# Patient Record
Sex: Male | Born: 1990 | Race: Black or African American | Hispanic: No | Marital: Single | Smoking: Current every day smoker
Health system: Southern US, Community
[De-identification: ages and names within clinical notes are randomized; demographics above are authoritative.]

---
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
## 2010-11-16 IMAGING — CR RIGHT HAND - COMPLETE 3+ VIEW
1 series · 3 of 3 positions shown · non-contrast
Comparison: none

REASON FOR EXAM: pain
COMMENTS:

PROCEDURE:     DXR - DXR HAND RT COMPLETE W/OBLIQUES  - September 22, 2009  [DATE]
RESULT:     Images of the right hand demonstrate no fracture, dislocation or
radiopaque foreign body.

[Series 1: view not recorded · 0.17mm/px · 3 of 3 slices shown]
[im 1/3]
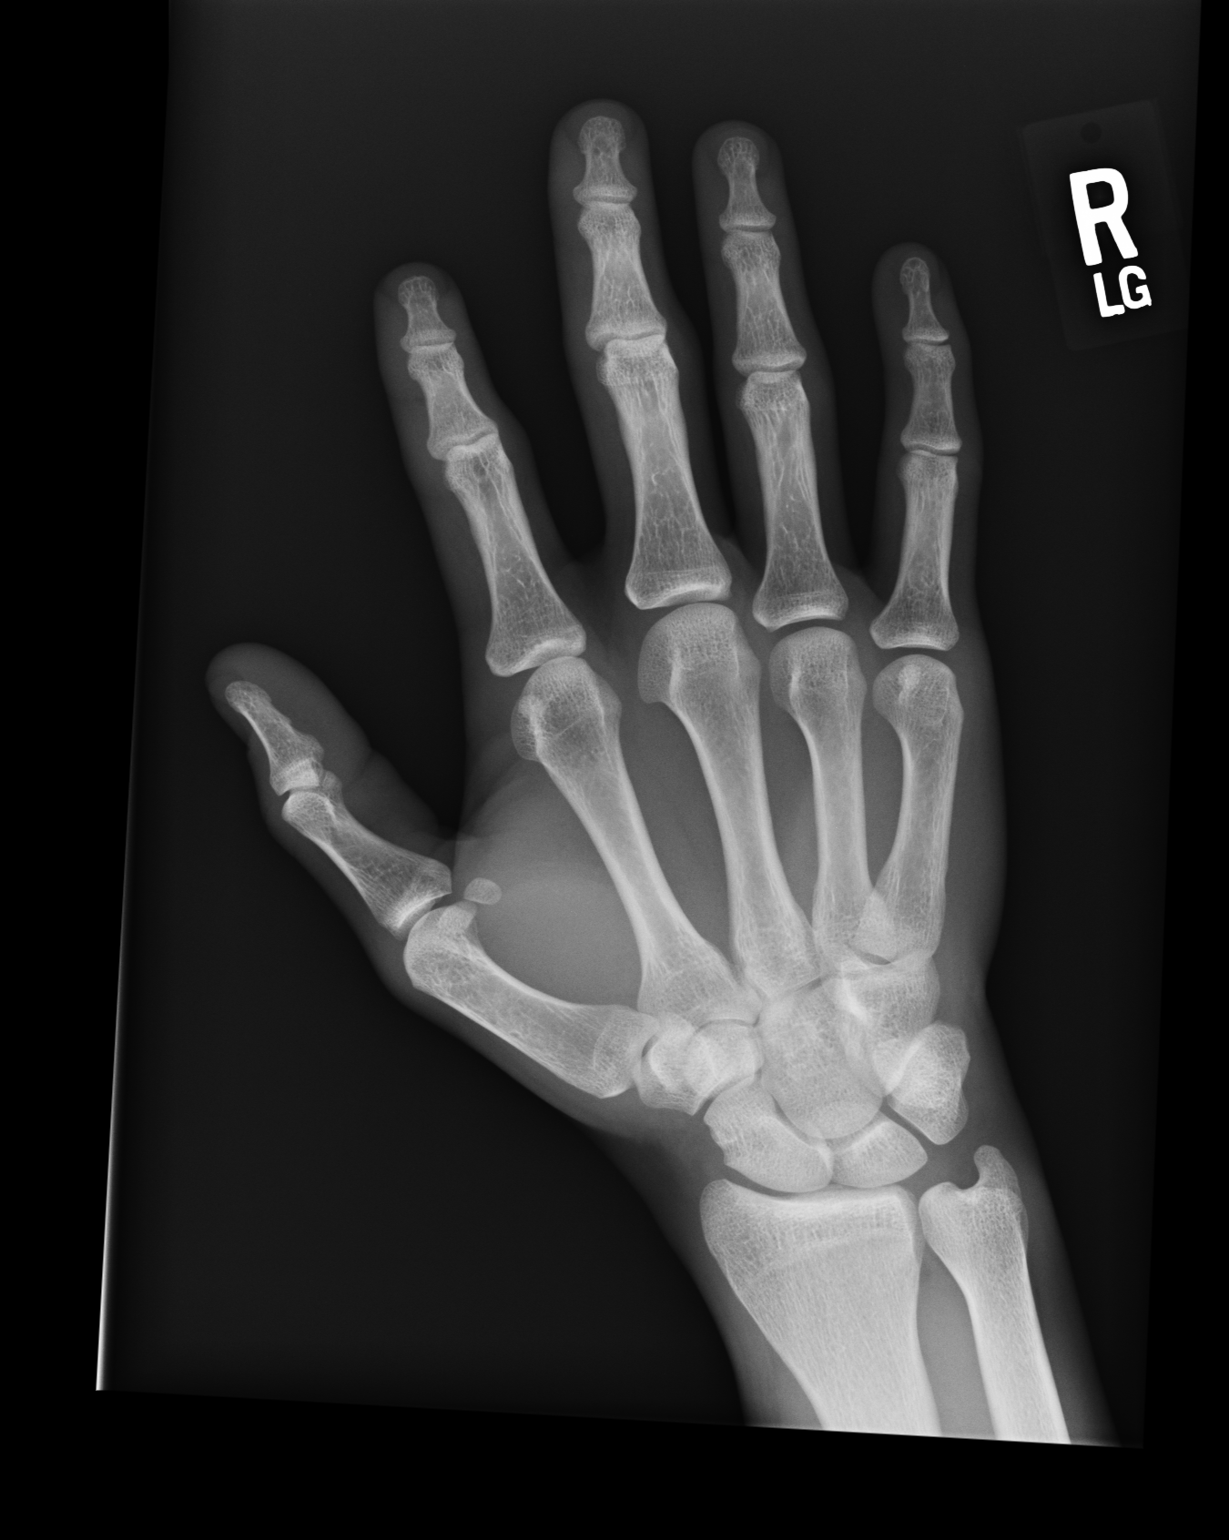
[im 2/3]
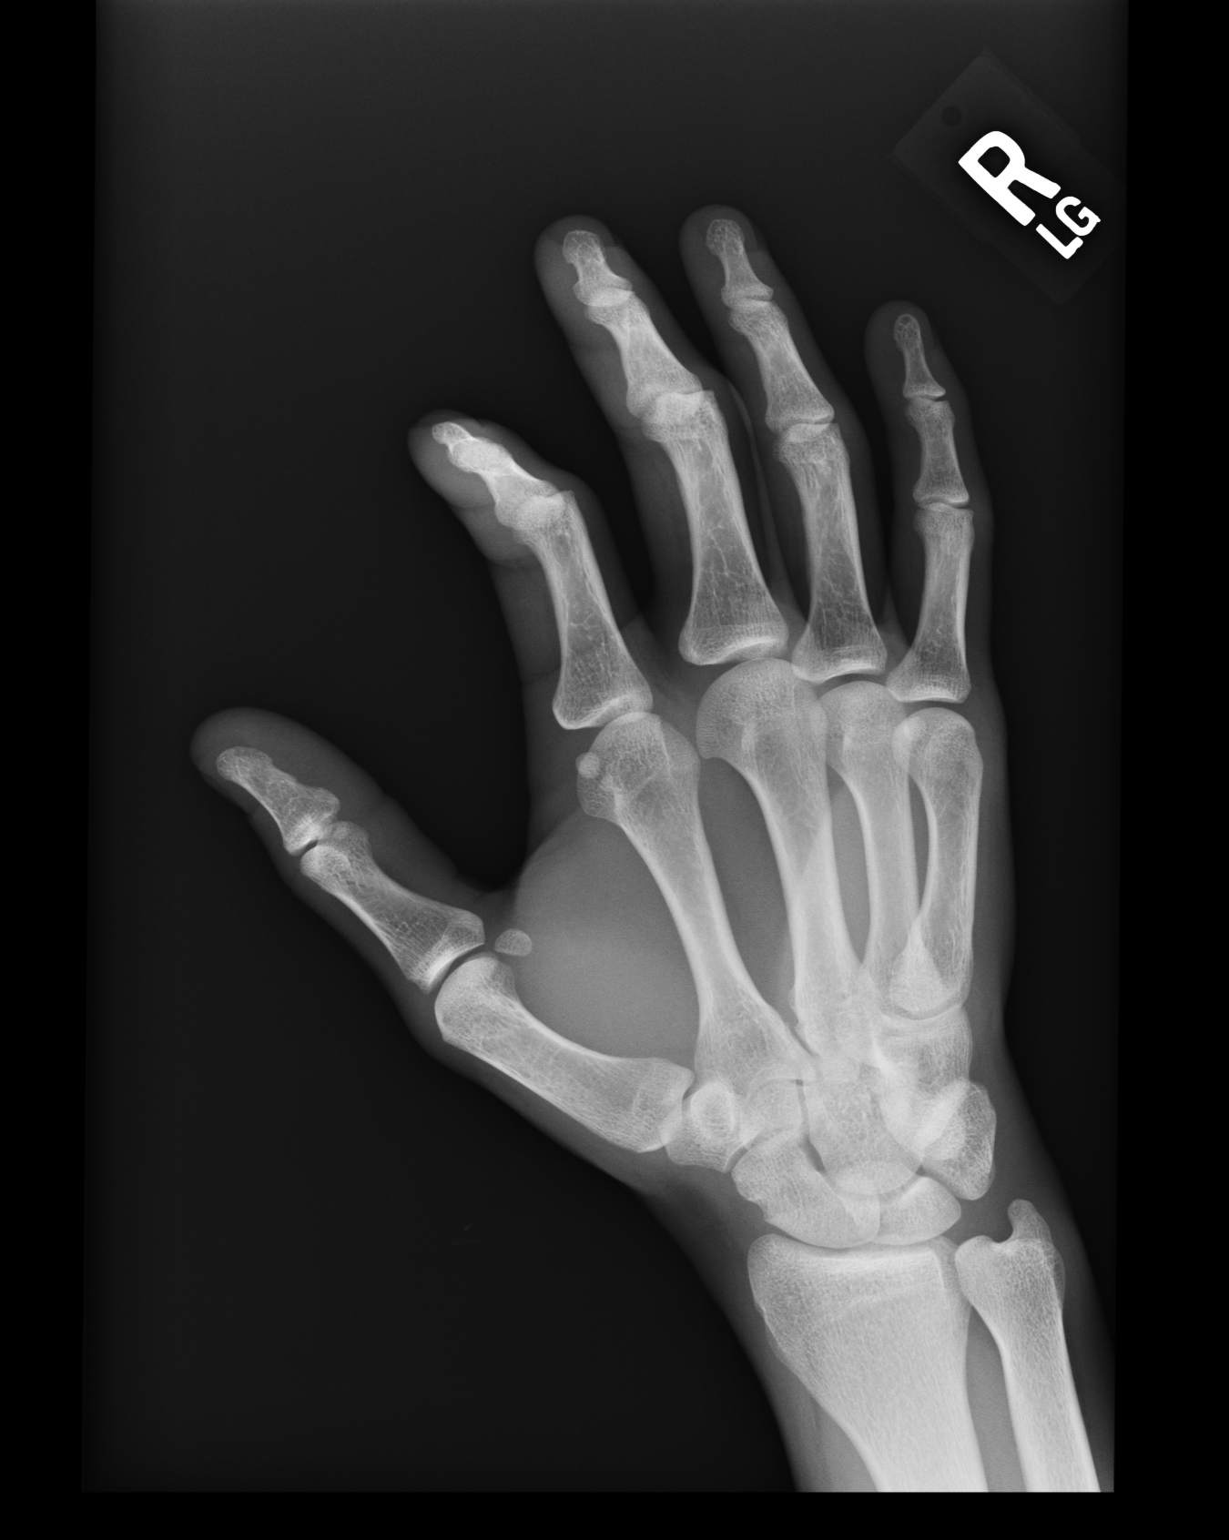
[im 3/3]
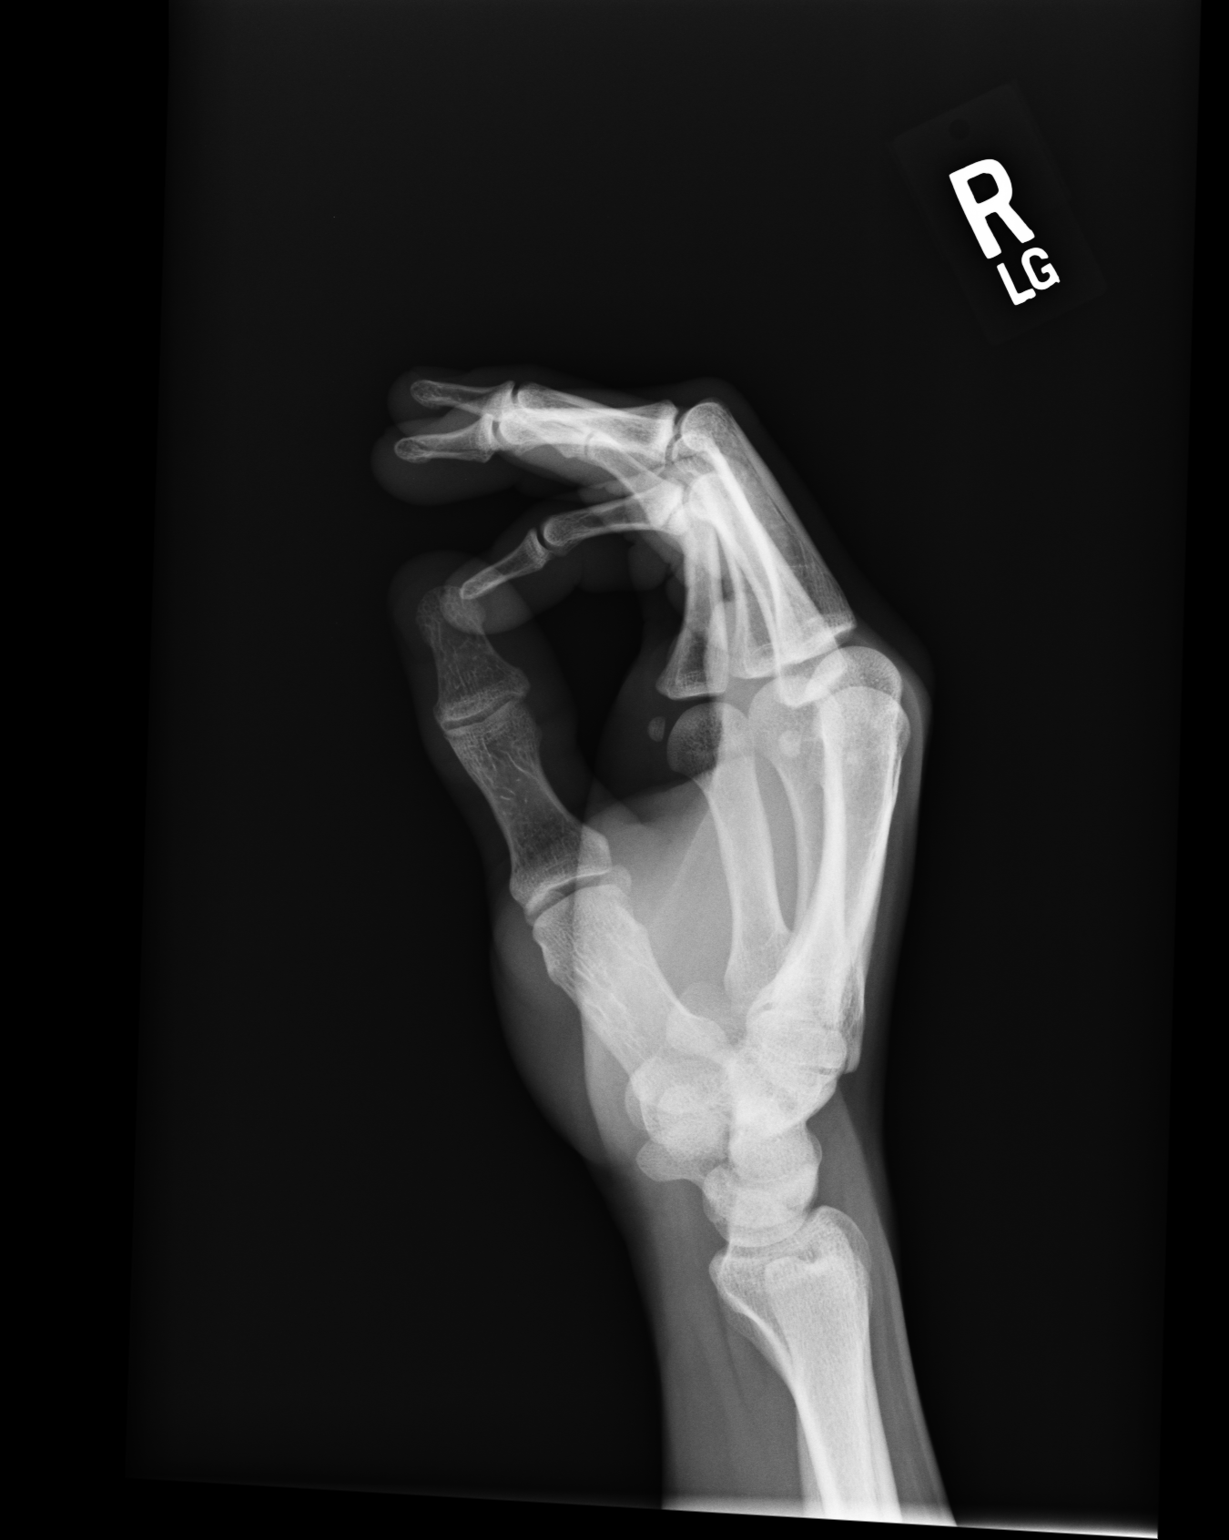

[3 of 3 positions shown; findings below may reference images not displayed]

IMPRESSION: Please see above.

## 2018-02-16 ENCOUNTER — Other Ambulatory Visit: Payer: Self-pay | Admitting: Counselor

## 2018-02-16 DIAGNOSIS — R1032 Left lower quadrant pain: Secondary | ICD-10-CM

## 2018-02-21 ENCOUNTER — Ambulatory Visit
Admission: RE | Admit: 2018-02-21 | Discharge: 2018-02-21 | Disposition: A | Discharge status: Home / Self Care | Payer: 59 | Source: Ambulatory Visit | Attending: Counselor | Admitting: Counselor

## 2018-03-09 ENCOUNTER — Ambulatory Visit: Payer: Self-pay | Admitting: Family Medicine

## 2019-03-14 ENCOUNTER — Telehealth: Payer: 59 | Admitting: Family

## 2019-03-14 DIAGNOSIS — F17200 Nicotine dependence, unspecified, uncomplicated: Secondary | ICD-10-CM

## 2019-03-14 DIAGNOSIS — J069 Acute upper respiratory infection, unspecified: Secondary | ICD-10-CM

## 2019-03-14 MED ORDER — BENZONATATE 100 MG PO CAPS: 100.0000 mg | ORAL_CAPSULE | Freq: Three times a day (TID) | ORAL | 0 refills | Status: DC | PRN

## 2019-03-14 MED ORDER — FLUTICASONE PROPIONATE 50 MCG/ACT NA SUSP: 2.0000 | Freq: Every day | NASAL | 6 refills | Status: DC

## 2019-03-14 NOTE — Progress Notes (Signed)
We are sorry you are not feeling well.  Here is how we plan to help!  Based on what you have shared with me, it looks like you may have a viral upper respiratory infection.  Upper respiratory infections are caused by a large number of viruses; however, rhinovirus is the most common cause.   As discussed on the phone, since you do have a fever I do recommend you avoid all contact with anyone 65 or older and self isolate yourself over the next 2 weeks. I hope you feel better soon!  I called patient to investigate his SOB. States he is a smoker and when he gets any cold or infection he get slightly SOB with exercise. No SOB or distress noted while talking to him on the phone. States this is no different from any other URI he has in the past.   Approximately 5 minutes was spent documenting and reviewing patient's chart.    Symptoms vary from person to person, with common symptoms including sore throat, cough, and fatigue or lack of energy.  A low-grade fever of up to 100.4 may present, but is often uncommon.  Symptoms vary however, and are closely related to a person's age or underlying illnesses.  The most common symptoms associated with an upper respiratory infection are nasal discharge or congestion, cough, sneezing, headache and pressure in the ears and face.  These symptoms usually persist for about 3 to 10 days, but can last up to 2 weeks.  It is important to know that upper respiratory infections do not cause serious illness or complications in most cases.    Upper respiratory infections can be transmitted from person to person, with the most common method of transmission being a person's hands.  The virus is able to live on the skin and can infect other persons for up to 2 hours after direct contact.  Also, these can be transmitted when someone coughs or sneezes; thus, it is important to cover the mouth to reduce this risk.  To keep the spread of the illness at bay, good hand hygiene is very  important.  This is an infection that is most likely caused by a virus. There are no specific treatments other than to help you with the symptoms until the infection runs its course.  We are sorry you are not feeling well.  Here is how we plan to help!   For nasal congestion, you may use an oral decongestants such as Mucinex D or if you have glaucoma or high blood pressure use plain Mucinex.  Saline nasal spray or nasal drops can help and can safely be used as often as needed for congestion.  For your congestion, I have prescribed Fluticasone nasal spray one spray in each nostril twice a day  If you do not have a history of heart disease, hypertension, diabetes or thyroid disease, prostate/bladder issues or glaucoma, you may also use Sudafed to treat nasal congestion.  It is highly recommended that you consult with a pharmacist or your primary care physician to ensure this medication is safe for you to take.     If you have a cough, you may use cough suppressants such as Delsym and Robitussin.  If you have glaucoma or high blood pressure, you can also use Coricidin HBP.   For cough I have prescribed for you A prescription cough medication called Tessalon Perles 100 mg. You may take 1-2 capsules every 8 hours as needed for cough  If you have a  sore or scratchy throat, use a saltwater gargle-  to  teaspoon of salt dissolved in a 4-ounce to 8-ounce glass of warm water.  Gargle the solution for approximately 15-30 seconds and then spit.  It is important not to swallow the solution.  You can also use throat lozenges/cough drops and Chloraseptic spray to help with throat pain or discomfort.  Warm or cold liquids can also be helpful in relieving throat pain.  For headache, pain or general discomfort, you can use Ibuprofen or Tylenol as directed.   Some authorities believe that zinc sprays or the use of Echinacea may shorten the course of your symptoms.   HOME CARE . Only take medications as instructed  by your medical team. . Be sure to drink plenty of fluids. Water is fine as well as fruit juices, sodas and electrolyte beverages. You may want to stay away from caffeine or alcohol. If you are nauseated, try taking small sips of liquids. How do you know if you are getting enough fluid? Your urine should be a pale yellow or almost colorless. . Get rest. . Taking a steamy shower or using a humidifier may help nasal congestion and ease sore throat pain. You can place a towel over your head and breathe in the steam from hot water coming from a faucet. . Using a saline nasal spray works much the same way. . Cough drops, hard candies and sore throat lozenges may ease your cough. . Avoid close contacts especially the very young and the elderly . Cover your mouth if you cough or sneeze . Always remember to wash your hands.   GET HELP RIGHT AWAY IF: . You develop worsening fever. . If your symptoms do not improve within 10 days . You develop yellow or green discharge from your nose over 3 days. . You have coughing fits . You develop a severe head ache or visual changes. . You develop shortness of breath, difficulty breathing or start having chest pain . Your symptoms persist after you have completed your treatment plan  MAKE SURE YOU   Understand these instructions.  Will watch your condition.  Will get help right away if you are not doing well or get worse.  Your e-visit answers were reviewed by a board certified advanced clinical practitioner to complete your personal care plan. Depending upon the condition, your plan could have included both over the counter or prescription medications. Please review your pharmacy choice. If there is a problem, you may call our nursing hot line at and have the prescription routed to another pharmacy. Your safety is important to [place]. If you have drug allergies check your prescription carefully.   You can use MyChart to ask questions about today's visit,  request a non-urgent call back, or ask for a work or school excuse for 24 hours related to this e-Visit. If it has been greater than 24 hours you will need to follow up with your provider, or enter a new e-Visit to address those concerns. You will get an e-mail in the next two days asking about your experience.  I hope that your e-visit has been valuable and will speed your recovery. Thank you for using e-visits.

## 2019-03-19 ENCOUNTER — Other Ambulatory Visit: Payer: Self-pay

## 2019-03-19 ENCOUNTER — Ambulatory Visit
Admission: EM | Admit: 2019-03-19 | Discharge: 2019-03-19 | Disposition: A | Discharge status: Home / Self Care | Payer: Self-pay | Attending: Family Medicine | Admitting: Family Medicine

## 2019-03-19 DIAGNOSIS — F17210 Nicotine dependence, cigarettes, uncomplicated: Secondary | ICD-10-CM

## 2019-03-19 DIAGNOSIS — J302 Other seasonal allergic rhinitis: Secondary | ICD-10-CM

## 2019-03-19 NOTE — ED Provider Notes (Signed)
[facility]    CSN: [id number] Arrival date & time: 03/19/19  1017     History   Chief Complaint Chief Complaint  Patient presents with  . Sinus Problem    HPI Stanley Brown is a 28 y.o. male.   28 year old male presents with history of seasonal allergies. He started to sneeze last week at work and he had to be evaluated for possible illness. He had an e-visit on 03/14/19 in which he had some nasal congestion, sore throat and cough. He denies any fever. He does smoke so he occasionally has shortness of breath with flare up of URI or allergy symptoms but denies any SOB today. He took Flonase with some relief. He has tried multiple antihistamines and Flonase in the past with minimal success in controlling allergy symptoms but his symptoms are usually short-term. Otherwise no chronic health issues. Needs a note to return to work today.   The history is provided by the patient.    History reviewed. No pertinent past medical history.  There are no active problems to display for this patient.   Past Surgical History:  Procedure Laterality Date  . EYE SURGERY Left 1998       Home Medications    Prior to Admission medications   Not on File    Family History History reviewed. No pertinent family history.  Social History Social History   Tobacco Use  . Smoking status: Current Every Day Smoker    Packs/day: 0.50    Types: Cigarettes  . Smokeless tobacco: Never Used  Substance Use Topics  . Alcohol use: Not Currently  . Drug use: Never     Allergies   Patient has no known allergies.   Review of Systems Review of Systems  Constitutional: Negative for activity change, appetite change, chills, fatigue and fever.  HENT: Positive for congestion (occasional nasal ), postnasal drip, sneezing and sore throat (irritated throat last week). Negative for ear discharge, ear pain, facial swelling, mouth sores, nosebleeds, rhinorrhea, sinus pressure, sinus pain and  trouble swallowing.   Eyes: Negative for pain, discharge, redness and itching.  Respiratory: Positive for cough (last week but not now). Negative for chest tightness, shortness of breath and wheezing.   Gastrointestinal: Negative for abdominal pain, diarrhea, nausea and vomiting.  Musculoskeletal: Negative for arthralgias, myalgias, neck pain and neck stiffness.  Skin: Negative for color change, rash and wound.  Allergic/Immunologic: Positive for environmental allergies. Negative for immunocompromised state.  Neurological: Negative for dizziness, tremors, seizures, syncope, weakness, light-headedness, numbness and headaches.  Hematological: Negative for adenopathy. Does not bruise/bleed easily.  Psychiatric/Behavioral: Negative.      Physical Exam Triage Vital Signs ED Triage Vitals  Enc Vitals Group     BP 03/19/19 1012 130/84     Pulse Rate 03/19/19 1012 74     Resp 03/19/19 1012 16     Temp 03/19/19 1012 98.8 F (37.1 C)     Temp Source 03/19/19 1012 Oral     SpO2 03/19/19 1012 100 %     Weight 03/19/19 1010 244 lb (110.7 kg)     Height 03/19/19 1010 6\' 4"  (1.93 m)     Head Circumference --      Peak Flow --      Pain Score 03/19/19 1010 0     Pain Loc --      Pain Edu? --      Excl. in GC? --    No data found.  Updated  Vital Signs BP 130/84 (BP Location: Right Arm)   Pulse 74   Temp 98.8 F (37.1 C) (Oral)   Resp 16   Ht  (1.93 m)   Wt 244 lb (110.7 kg)   SpO2 100%   BMI 29.70 kg/m   Visual Acuity Right Eye Distance:   Left Eye Distance:   Bilateral Distance:    Right Eye Near:   Left Eye Near:    Bilateral Near:     Physical Exam Vitals signs and nursing note reviewed.  Constitutional:      General: He is awake. He is not in acute distress.    Appearance: Normal appearance. He is well-developed, well-groomed and normal weight. He is not ill-appearing.     Comments: He is sitting comfortably on exam table in no acute distress and does not appear  ill.   HENT:     Head: Normocephalic and atraumatic.     Right Ear: Hearing, tympanic membrane, ear canal and external ear normal.     Left Ear: Hearing, tympanic membrane, ear canal and external ear normal.     Nose: Nose normal. No congestion or rhinorrhea.     Right Sinus: No maxillary sinus tenderness or frontal sinus tenderness.     Left Sinus: No maxillary sinus tenderness or frontal sinus tenderness.     Mouth/Throat:     Lips: Pink.     Mouth: Mucous membranes are moist.     Pharynx: Oropharynx is clear. Uvula midline. No pharyngeal swelling, oropharyngeal exudate, posterior oropharyngeal erythema or uvula swelling.  Eyes:     Extraocular Movements: Extraocular movements intact.     Conjunctiva/sclera: Conjunctivae normal.  Neck:     Musculoskeletal: Normal range of motion and neck supple. No neck rigidity or muscular tenderness.  Cardiovascular:     Rate and Rhythm: Normal rate and regular rhythm.     Heart sounds: Normal heart sounds. No murmur.  Pulmonary:     Effort: Pulmonary effort is normal. No respiratory distress.     Breath sounds: Normal breath sounds and air entry. No decreased air movement. No decreased breath sounds, wheezing, rhonchi or rales.  Musculoskeletal: Normal range of motion.  Lymphadenopathy:     Cervical: No cervical adenopathy.  Skin:    General: Skin is warm and dry.     Capillary Refill: Capillary refill takes less than 2 seconds.     Findings: No rash.  Neurological:     General: No focal deficit present.     Mental Status: He is alert and oriented to person, place, and time.  Psychiatric:        Mood and Affect: Mood normal.        Behavior: Behavior is cooperative.        Thought Content: Thought content normal.        Judgment: Judgment normal.      UC Treatments / Results  Labs (all labs ordered are listed, but only abnormal results are displayed) Labs Reviewed - No data to display  EKG None  Radiology No results found.   Procedures Procedures (including critical care time)  Medications Ordered in UC Medications - No data to display  Initial Impression / Assessment and Plan / UC Course  I have reviewed the triage vital signs and the nursing notes.  Pertinent labs & imaging results that were available during my care of the patient were reviewed by me and considered in my medical decision making (see chart for details).  Discussed that he has intermittent seasonal allergies- may try a different OTC steroid nasal spray such as Nasonex or Rhinocort to help with symptoms when they occur. Currently asymptomatic. Note written that he may return to work today with no restrictions. Follow-up as needed.  Final Clinical Impressions(s) / UC Diagnoses   Final diagnoses:  Seasonal allergic rhinitis, unspecified trigger     Discharge Instructions     May recommend trying OTC Nasonex or Rhinocort nasal spray as directed to help with allergies. Follow-up as needed.     ED Prescriptions    None     Controlled Substance Prescriptions [place] Controlled Substance Registry consulted? Not Applicable   Sudie Grumbling, NP 03/19/19 1044

## 2019-03-19 NOTE — Discharge Instructions (Addendum)
May recommend trying OTC Nasonex or Rhinocort nasal spray as directed to help with allergies. Follow-up as needed.

## 2019-03-19 NOTE — ED Triage Notes (Signed)
Patient states that he has allergies at baseline. Patient states that he sneezed last week and was turned in to [employer] for being sick. Patient states that he is not sick just has nasal congestion and drainage like his typical spring allergy baseline. Patient states that work will not him return until having a note saying he is not sick.

## 2019-08-06 ENCOUNTER — Other Ambulatory Visit: Payer: Self-pay

## 2019-08-06 DIAGNOSIS — Z20822 Contact with and (suspected) exposure to covid-19: Secondary | ICD-10-CM

## 2019-08-07 LAB — NOVEL CORONAVIRUS, NAA: SARS-CoV-2, NAA: DETECTED — AB

## 2019-10-29 ENCOUNTER — Other Ambulatory Visit: Payer: Self-pay

## 2019-10-29 DIAGNOSIS — Z20822 Contact with and (suspected) exposure to covid-19: Secondary | ICD-10-CM

## 2019-10-30 LAB — NOVEL CORONAVIRUS, NAA: SARS-CoV-2, NAA: NOT DETECTED
